# Patient Record
Sex: Male | Born: 1995 | Race: Black or African American | Hispanic: No | Smoking: Current every day smoker
Health system: Southern US, Community
[De-identification: ages and names within clinical notes are randomized; demographics above are authoritative.]

## PROBLEM LIST (undated history)

## (undated) HISTORY — PX: ANKLE SURGERY: SHX546

---
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
## 2022-05-31 ENCOUNTER — Other Ambulatory Visit: Payer: Self-pay

## 2022-05-31 ENCOUNTER — Emergency Department (HOSPITAL_BASED_OUTPATIENT_CLINIC_OR_DEPARTMENT_OTHER)
Admission: EM | Admit: 2022-05-31 | Discharge: 2022-05-31 | Disposition: A | Discharge status: Home / Self Care | Payer: No Typology Code available for payment source | Attending: Emergency Medicine | Admitting: Emergency Medicine

## 2022-05-31 ENCOUNTER — Other Ambulatory Visit (HOSPITAL_BASED_OUTPATIENT_CLINIC_OR_DEPARTMENT_OTHER): Payer: Self-pay

## 2022-05-31 ENCOUNTER — Encounter (HOSPITAL_BASED_OUTPATIENT_CLINIC_OR_DEPARTMENT_OTHER): Payer: Self-pay | Admitting: Emergency Medicine

## 2022-05-31 DIAGNOSIS — M545 Low back pain, unspecified: Secondary | ICD-10-CM | POA: Insufficient documentation

## 2022-05-31 MED ORDER — NAPROXEN 500 MG PO TABS
500.0000 mg | ORAL_TABLET | Freq: Two times a day (BID) | ORAL | 0 refills | Status: AC
  Filled 2022-05-31: qty 30, 15d supply, fill #0

## 2022-05-31 MED ORDER — KETOROLAC TROMETHAMINE 30 MG/ML IJ SOLN
30.0000 mg | Freq: Once | INTRAMUSCULAR | Status: AC
  Administered 2022-05-31: 30 mg via INTRAMUSCULAR
  Filled 2022-05-31: qty 1

## 2022-05-31 MED ORDER — METHOCARBAMOL 500 MG PO TABS
500.0000 mg | ORAL_TABLET | Freq: Two times a day (BID) | ORAL | 0 refills | Status: AC
  Filled 2022-05-31: qty 20, 10d supply, fill #0

## 2022-05-31 NOTE — ED Provider Notes (Addendum)
   [facility] EMERGENCY DEPARTMENT  Provider Note  CSN: [id number] Arrival date & time: 05/31/22 0040  History Chief Complaint  Patient presents with   Back Pain    Johnathan Thomas is a 26 y.o. male reports he was at work earlier [employer] when he began to have R lower back pain, worse with bending and twisting. Does not radiate. No fevers or problems with bowels/bladder.    Home Medications Prior to Admission medications   Medication Sig Start Date End Date Taking? Authorizing Provider  methocarbamol (ROBAXIN) 500 MG tablet Take 1 tablet (500 mg total) by mouth 2 (two) times daily. 05/31/22  Yes Pollyann Savoy, MD  naproxen (NAPROSYN) 500 MG tablet Take 1 tablet (500 mg total) by mouth 2 (two) times daily. 05/31/22  Yes Pollyann Savoy, MD     Allergies    Patient has no known allergies.   Review of Systems   Review of Systems Please see HPI for pertinent positives and negatives  Physical Exam BP (!) 151/88 (BP Location: Right Arm)   Pulse 64   Temp 98.3 F (36.8 C) (Oral)   Resp 18   Ht 5\' 8"  (1.727 m)   Wt 72.6 kg   SpO2 100%   BMI 24.33 kg/m   Physical Exam Vitals and nursing note reviewed.  HENT:     Head: Normocephalic.     Nose: Nose normal.  Eyes:     Extraocular Movements: Extraocular movements intact.  Pulmonary:     Effort: Pulmonary effort is normal.  Musculoskeletal:        General: Tenderness (R lumbar paraspinal muscles) present. Normal range of motion.     Cervical back: Neck supple.  Skin:    Findings: No rash (on exposed skin).  Neurological:     Mental Status: He is alert and oriented to person, place, and time.     Sensory: No sensory deficit.     Motor: No weakness.  Psychiatric:        Mood and Affect: Mood normal.     ED Results / Procedures / Treatments   EKG None  Procedures Procedures  Medications Ordered in the ED Medications  ketorolac (TORADOL) 30 MG/ML injection 30 mg (has no  administration in time range)    Initial Impression and Plan  Patient with MSK low back pain, no red flags. Plan IM toradol for pain control. Rx for Naprosyn, Robaxin. Rest, ice and PCP follow up if not improving.   ED Course       MDM Rules/Calculators/A&P Medical Decision Making Problems Addressed: Acute right-sided low back pain without sciatica: acute illness or injury  Risk Prescription drug management.    Final Clinical Impression(s) / ED Diagnoses Final diagnoses:  Acute right-sided low back pain without sciatica    Rx / DC Orders ED Discharge Orders          Ordered    naproxen (NAPROSYN) 500 MG tablet  2 times daily        05/31/22 0148    methocarbamol (ROBAXIN) 500 MG tablet  2 times daily        05/31/22 0148               06/02/22, MD 05/31/22 [phone]

## 2022-05-31 NOTE — ED Triage Notes (Signed)
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

## 2022-07-28 ENCOUNTER — Other Ambulatory Visit (HOSPITAL_COMMUNITY): Payer: Self-pay

## 2022-08-28 ENCOUNTER — Other Ambulatory Visit (HOSPITAL_COMMUNITY): Payer: Self-pay

## 2023-06-15 ENCOUNTER — Other Ambulatory Visit (HOSPITAL_COMMUNITY): Payer: Self-pay
